# Patient Record
Sex: Male | Born: 1950
Health system: Southern US, Community
[De-identification: ages and names within clinical notes are randomized; demographics above are authoritative.]

---

## 2013-01-30 ENCOUNTER — Observation Stay: Payer: Self-pay | Admitting: Internal Medicine

## 2013-01-30 LAB — COMPREHENSIVE METABOLIC PANEL
Alkaline Phosphatase: 112 U/L (ref 50–136)
Bilirubin,Total: 1.2 mg/dL — ABNORMAL HIGH (ref 0.2–1.0)
Calcium, Total: 9.1 mg/dL (ref 8.5–10.1)
Co2: 25 mmol/L (ref 21–32)
Creatinine: 0.98 mg/dL (ref 0.60–1.30)
EGFR (African American): >60
EGFR (Non-African Amer.): >60
Glucose: 159 mg/dL — ABNORMAL HIGH (ref 65–99)
Osmolality: 277 (ref 275–301)
Potassium: 3.8 mmol/L (ref 3.5–5.1)
SGOT(AST): 31 U/L (ref 15–37)
SGPT (ALT): 28 U/L (ref 12–78)

## 2013-01-30 LAB — CK TOTAL AND CKMB (NOT AT ARMC)
CK, Total: 57 U/L (ref 35–232)
CK, Total: 52 U/L (ref 35–232)
CK, Total: 55 U/L (ref 35–232)
CK-MB: 1 ng/mL (ref 0.5–3.6)

## 2013-01-30 LAB — TROPONIN I: Troponin-I: <0.02 ng/mL

## 2013-01-30 LAB — CBC WITH DIFFERENTIAL/PLATELET
Basophil #: 0.1 10*3/uL (ref 0.0–0.1)
Basophil %: 0.8 %
HCT: 42.5 % (ref 40.0–52.0)
Lymphocyte #: 1.3 10*3/uL (ref 1.0–3.6)
Lymphocyte %: 17.7 %
MCH: 28.7 pg (ref 26.0–34.0)
MCV: 85 fL (ref 80–100)
Neutrophil #: 4.9 10*3/uL (ref 1.4–6.5)
Neutrophil %: 69 %
Platelet: 142 10*3/uL — ABNORMAL LOW (ref 150–440)
RBC: 5.03 10*6/uL (ref 4.40–5.90)
RDW: 13.2 % (ref 11.5–14.5)
WBC: 7.2 10*3/uL (ref 3.8–10.6)

## 2013-01-31 DIAGNOSIS — R079 Chest pain, unspecified: Secondary | ICD-10-CM

## 2013-01-31 LAB — CBC WITH DIFFERENTIAL/PLATELET
Basophil %: 0.8 %
HCT: 39.2 % — ABNORMAL LOW (ref 40.0–52.0)
HGB: 13.2 g/dL (ref 13.0–18.0)
Lymphocyte %: 26.6 %
MCH: 28.5 pg (ref 26.0–34.0)
MCHC: 33.5 g/dL (ref 32.0–36.0)
Monocyte #: 0.5 x10 3/mm (ref 0.2–1.0)
Neutrophil %: 58.7 %
RBC: 4.62 10*6/uL (ref 4.40–5.90)
WBC: 5 10*3/uL (ref 3.8–10.6)

## 2013-01-31 LAB — LIPID PANEL
Cholesterol: 139 mg/dL
HDL Cholesterol: 27 mg/dL — ABNORMAL LOW
Ldl Cholesterol, Calc: 85 mg/dL
Triglycerides: 137 mg/dL
VLDL Cholesterol, Calc: 27 mg/dL

## 2013-01-31 LAB — BASIC METABOLIC PANEL
Anion Gap: 1 — ABNORMAL LOW (ref 7–16)
BUN: 11 mg/dL (ref 7–18)
Calcium, Total: 8.9 mg/dL (ref 8.5–10.1)
Co2: 31 mmol/L (ref 21–32)
Potassium: 3.9 mmol/L (ref 3.5–5.1)

## 2013-01-31 LAB — MAGNESIUM: Magnesium: 1.9 mg/dL

## 2013-01-31 LAB — HEMOGLOBIN A1C: Hemoglobin A1C: 7.1 % — ABNORMAL HIGH

## 2013-02-01 LAB — CBC WITH DIFFERENTIAL/PLATELET
Basophil #: 0 10*3/uL (ref 0.0–0.1)
Eosinophil %: 3.6 %
MCH: 28.7 pg (ref 26.0–34.0)
MCHC: 33.9 g/dL (ref 32.0–36.0)
MCV: 85 fL (ref 80–100)
Monocyte %: 11.7 %
Neutrophil #: 2.9 10*3/uL (ref 1.4–6.5)
Neutrophil %: 59.7 %
Platelet: 122 10*3/uL — ABNORMAL LOW (ref 150–440)
RBC: 4.68 10*6/uL (ref 4.40–5.90)
RDW: 13.1 % (ref 11.5–14.5)

## 2013-02-01 LAB — BASIC METABOLIC PANEL
Anion Gap: 3 — ABNORMAL LOW (ref 7–16)
BUN: 16 mg/dL (ref 7–18)
Calcium, Total: 8.8 mg/dL (ref 8.5–10.1)
EGFR (Non-African Amer.): >60
Glucose: 142 mg/dL — ABNORMAL HIGH (ref 65–99)
Osmolality: 283 (ref 275–301)
Potassium: 3.9 mmol/L (ref 3.5–5.1)
Sodium: 140 mmol/L (ref 136–145)

## 2013-02-01 LAB — PROTIME-INR: INR: 1
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
## 2014-10-28 NOTE — Consult Note (Signed)
PATIENT NAME:  Barry Cunningham, Barry Cunningham MR#:  [id number] DATE OF BIRTH:  [birth year]  CARDIOLOGY CONSULTATION  DATE OF CONSULTATION:  01/31/2013  REFERRING PHYSICIAN:  Masoud.  CHIEF COMPLAINT: Chest pain.   REASON FOR CONSULTATION: Consultation requested for evaluation of unstable angina.   HISTORY OF PRESENT ILLNESS: The patient is a 64 year old gentleman with a prior history of coronary artery disease, admitted at this time with new-onset chest pain. The patient reports that he has had previous myocardial infarctions, but has not had any chest pain since 2008. The patient has not undergone previous cardiac catheterization because at one time the patient was nearly 600 pounds.   The patient was in his usual state of health until the day of admission while traveling from [place] to [place] to live with his daughter.  He developed 9 out of 10 chest pain. [facility]pped [facility] Emergency Room. CT scan was performed which did not reveal evidence for pulmonary emboli. The patient was admitted to telemetry, where he has ruled out for a myocardial infarction by CPK isoenzymes, and troponin.   PAST MEDICAL HISTORY: 1. Multiple prior myocardial infarctions.  2. Borderline diabetes.  3. Previous history of extreme obesity; has been on a rice diet with a significant weight loss.   MEDICATIONS: Sublingual nitroglycerin, p.r.n.   SOCIAL HISTORY: The patient is married. He is currently moving to [place] to live with their daughter.   FAMILY HISTORY: No immediate family history of coronary artery disease or myocardial infarction.   REVIEW OF SYSTEMS:  CONSTITUTIONAL: No fever or chills.   EYES: No blurry vision.  EARS: No hearing loss.  RESPIRATORY: No shortness of breath.  CARDIOVASCULAR: Chest pain, as described above.  GASTROINTESTINAL: No nausea, vomiting, or diarrhea.  GENITOURINARY: No dysuria or hematuria.  ENDOCRINE: No polyuria or polydipsia.  MUSCULOSKELETAL: No arthralgias or myalgias.   NEUROLOGICAL: No focal muscle weakness or numbness.  PSYCHOLOGICAL: No depression or anxiety.   PHYSICAL EXAMINATION: VITAL SIGNS: Blood pressure 107/64, pulse 71, respirations 18, temperature 97.8, pulse ox 97%.  HEENT: Pupils equal and reactive to light and accommodation.  NECK: Supple, without thyromegaly.  LUNGS: Clear.  CARDIOVASCULAR: Normal JVP. Normal PMI. Regular rate and rhythm. Normal S1, S2. No appreciable gallop, murmur, or rub.  ABDOMEN: Soft and nontender. Pulses were intact bilaterally.  MUSCULOSKELETAL: Normal.  NEUROLOGIC: The patient is alert and oriented x 3. Motor and sensory both grossly intact.   IMPRESSION: A 64 year old gentleman with known coronary artery disease, with unstable angina. Has ruled out for a myocardial infarction by CPK isoenzymes and troponin.   RECOMMENDATIONS: 1.  Agree with overall current therapy.  2.  Proceed with cardiac catheterization with selective coronary arteriography via a right radial approach on 02/01/2013. The risks, benefits, and alternatives were explained and informed written consent obtained.    ____________________________ Marcina MillardAlexander Zarayah Lanting, MD ap:dm D: 01/31/2013 09:04:04 ET T: 01/31/2013 09:41:27 ET JOB#: [id number]  cc: Marcina MillardAlexander Erinn Huskins, MD, <Dictator> Marcina MillardALEXANDER Ukiah Trawick MD ELECTRONICALLY SIGNED 03/03/2013 15:58
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
## 2014-12-08 IMAGING — CT CT CHEST W/ CM
1 series · 16 of 32 positions shown, 20 images · non-contrast
Comparison: none

REASON FOR EXAM: cp borderlline hi ddimer
COMMENTS:

PROCEDURE:     CT  - CT CHEST (FOR PE) W  - January 30, 2013  [DATE]
RESULT:     Chest CT dated 01/30/2013
TECHNIQUE: Helical 3 mm sections were obtained from the thoracic inlet
through the lung bases status post intravenous administration of 100 mL of
Qsovue-AA1.

[Series 6: soft tissue · axial · 0.74mm/px · z∈[+546,+818]mm · 16 of 99 slices shown, 20 images]
[im 4/99  mediastinal]
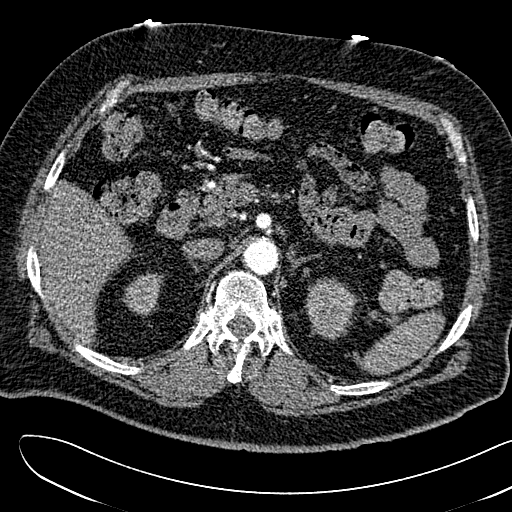
[im 4/99  lung]
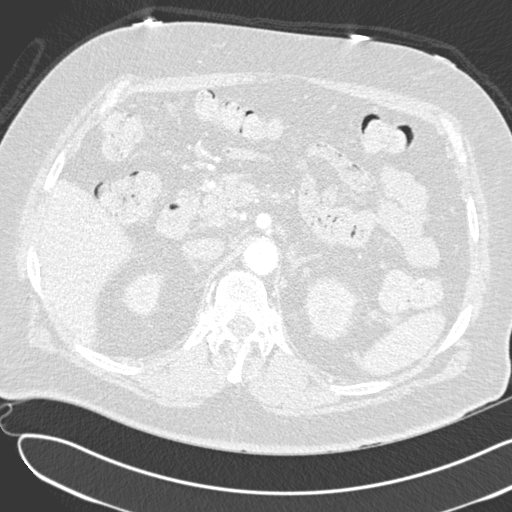
[im 11/99  lung]
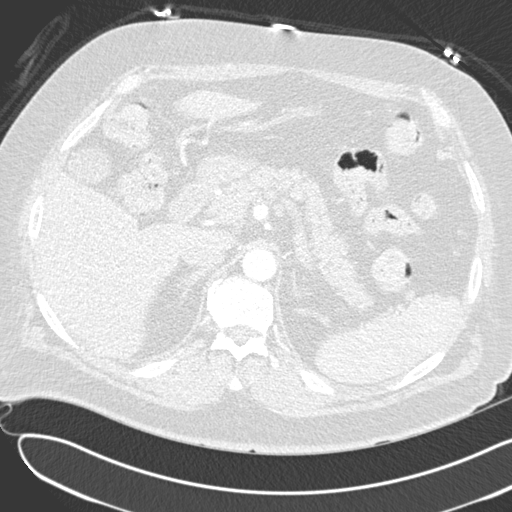
[im 19/99  lung]
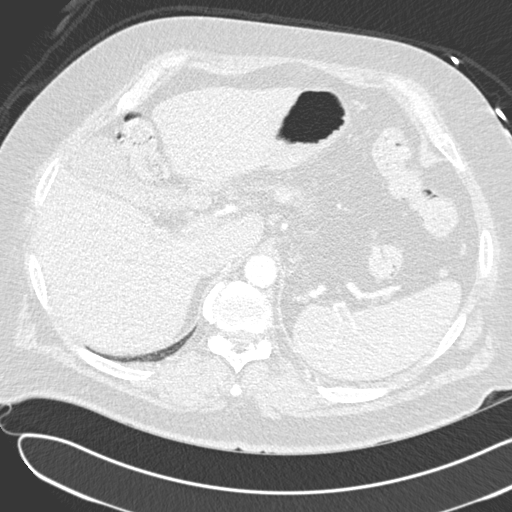
[im 22/99  lung]
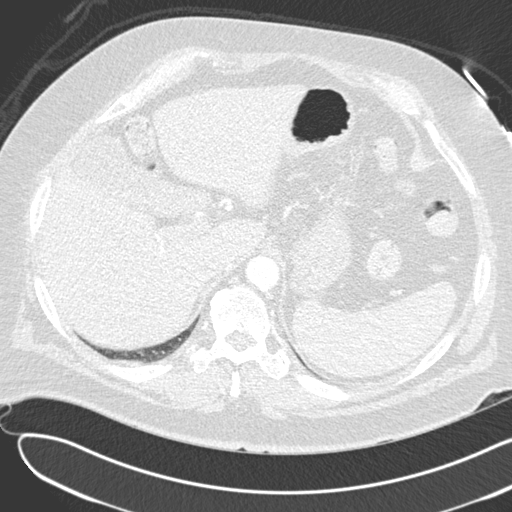
[im 30/99  mediastinal]
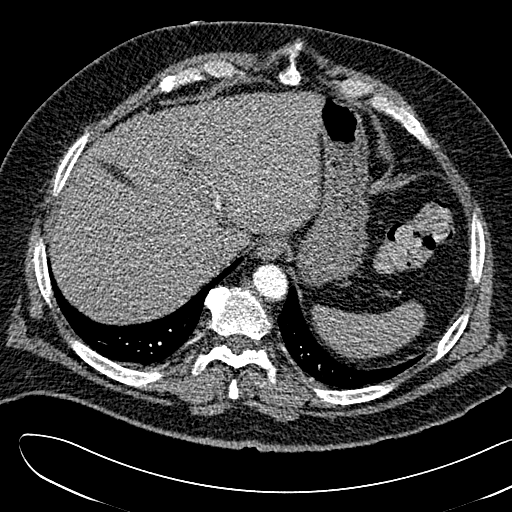
[im 30/99  lung]
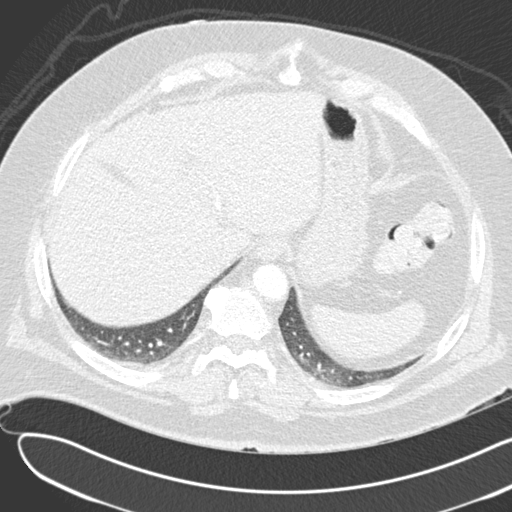
[im 37/99  lung]
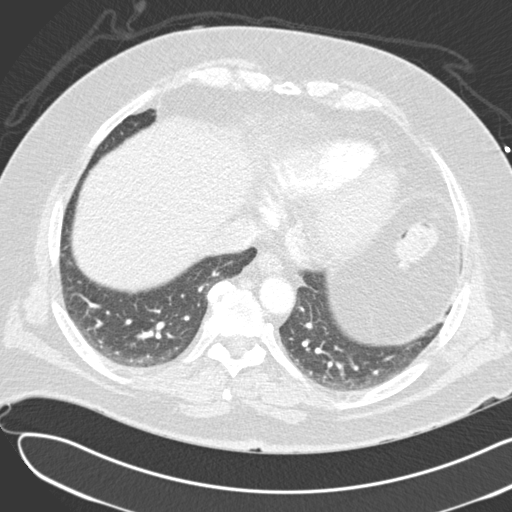
[im 44/99  lung]
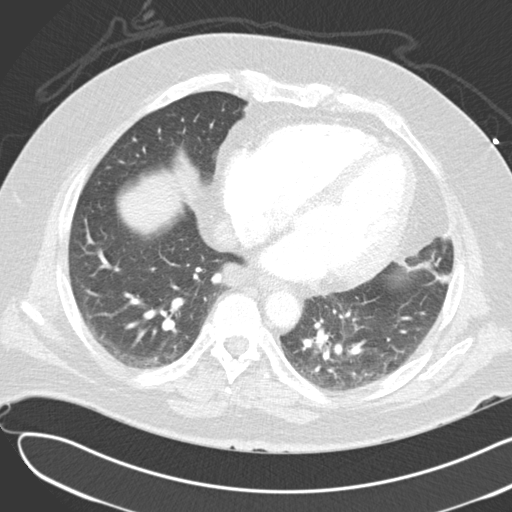
[im 48/99  lung]
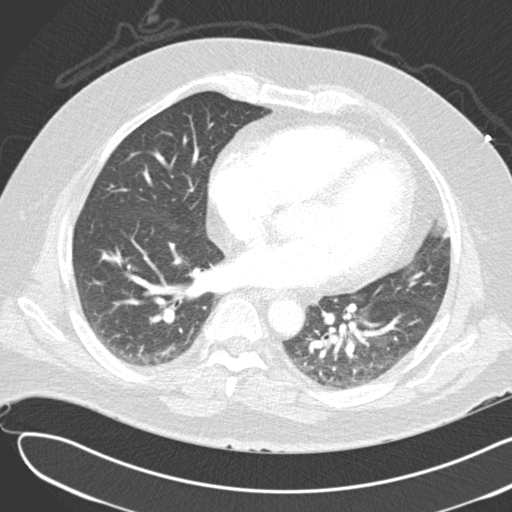
[im 51/99  mediastinal]
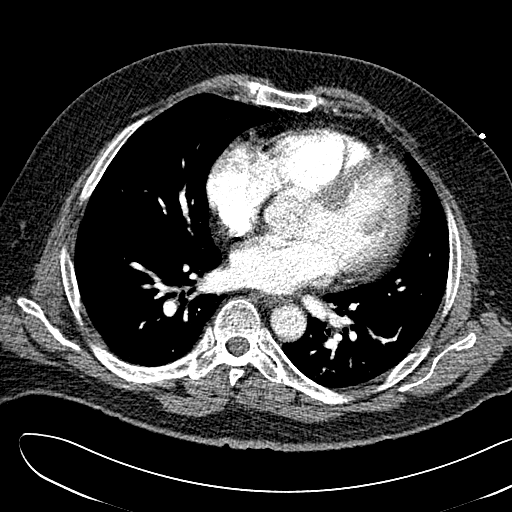
[im 51/99  lung]
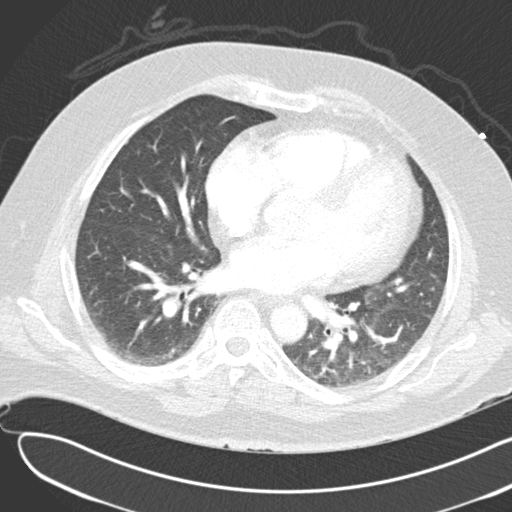
[im 55/99  lung]
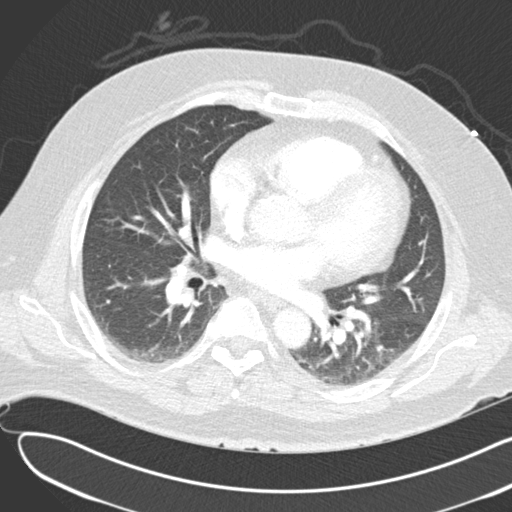
[im 62/99  lung]
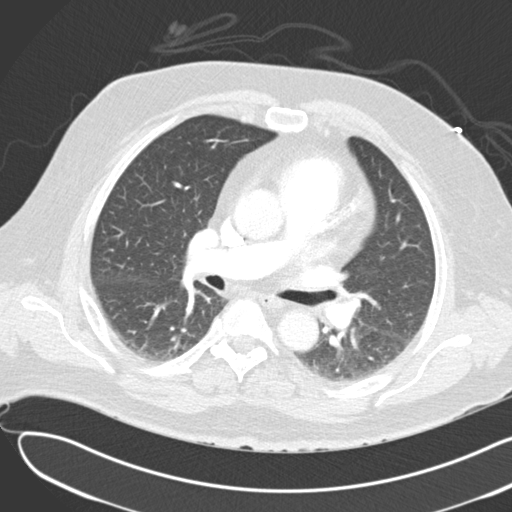
[im 69/99  lung]
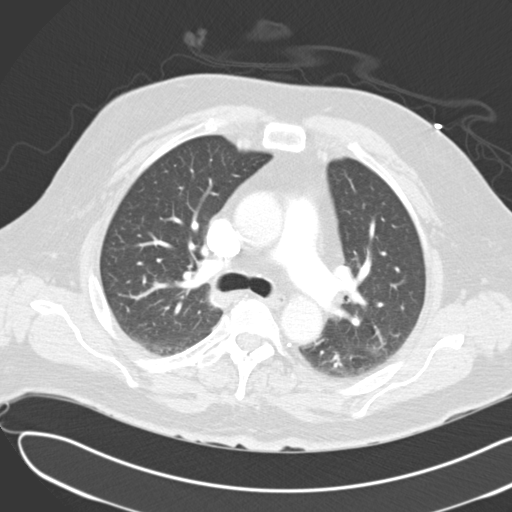
[im 77/99  mediastinal]
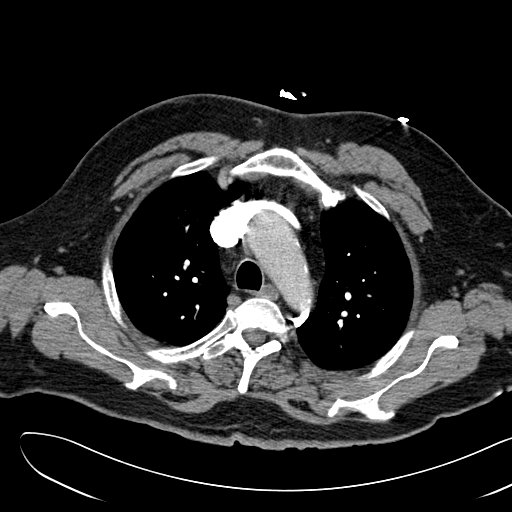
[im 77/99  lung]
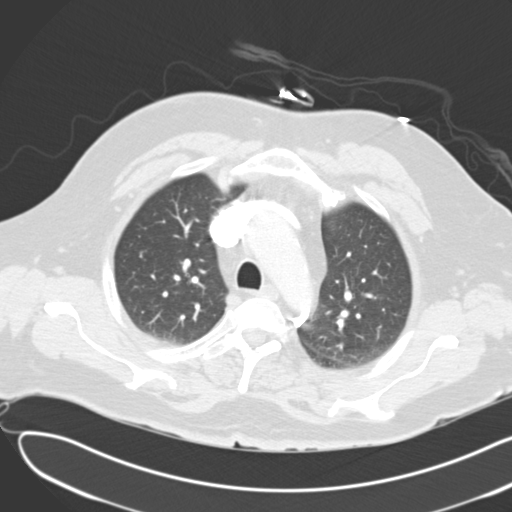
[im 80/99  lung]
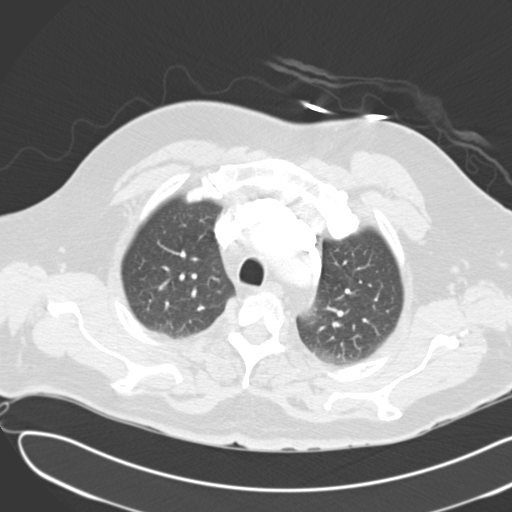
[im 88/99  lung]
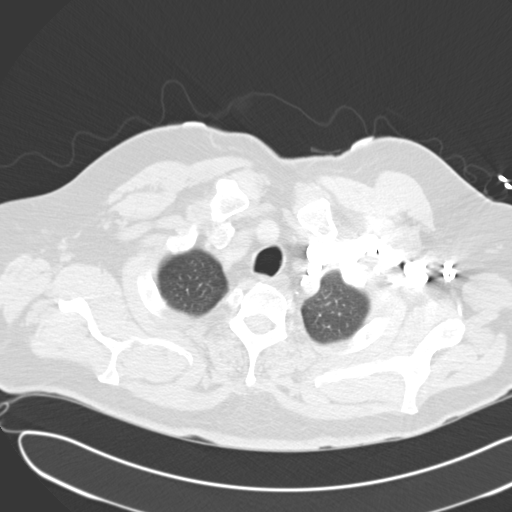
[im 95/99  lung]
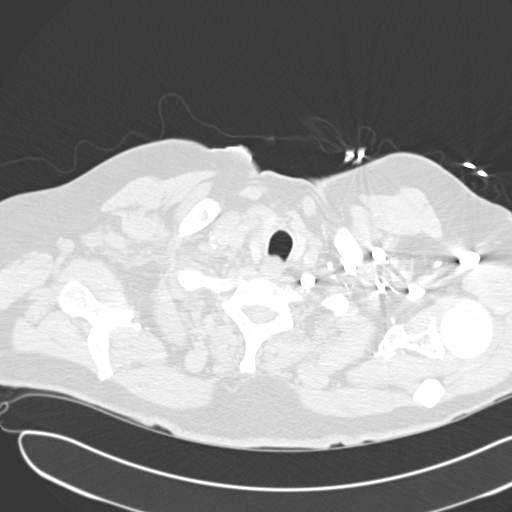

[16 of 32 positions shown; findings below may reference images not displayed]

FINDINGS: The thoracic inlet grossly unremarkable.

The mediastinum, hilar regions, and structures demonstrate no evidence of
mediastinal nor hilar adenopathy nor masses. Multichamber cardiac
enlargement is identified. Vascular calcifications identified within the
coronary arteries left greater than right. There is no evidence of filling
defects within the main, lobar, or segmental pulmonary arteries.

The lung parenchyma demonstrates mild hypoventilation no focal recent
consolidation, infiltrates, effusions, or edema.

Visualized upper abdominal viscera demonstrate no gross abnormalities.
IMPRESSION: 1. Cardiomegaly
2. No CT evidence of pulmonary to embolic disease.
3. No focal acute abnormalities identified.
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
